# Patient Record
Sex: Female | Born: 1965 | Hispanic: No | Marital: Single | Smoking: Current every day smoker
Health system: Southern US, Community
[De-identification: ages and names within clinical notes are randomized; demographics above are authoritative.]

## PROBLEM LIST (undated history)

## (undated) DIAGNOSIS — J449 Chronic obstructive pulmonary disease, unspecified: Secondary | ICD-10-CM

## (undated) DIAGNOSIS — J45909 Unspecified asthma, uncomplicated: Secondary | ICD-10-CM

## (undated) DIAGNOSIS — F32A Depression, unspecified: Secondary | ICD-10-CM

## (undated) DIAGNOSIS — F329 Major depressive disorder, single episode, unspecified: Secondary | ICD-10-CM

## (undated) DIAGNOSIS — K635 Polyp of colon: Secondary | ICD-10-CM

## (undated) DIAGNOSIS — E871 Hypo-osmolality and hyponatremia: Secondary | ICD-10-CM

## (undated) DIAGNOSIS — G40909 Epilepsy, unspecified, not intractable, without status epilepticus: Secondary | ICD-10-CM

## (undated) DIAGNOSIS — I10 Essential (primary) hypertension: Secondary | ICD-10-CM

## (undated) DIAGNOSIS — K219 Gastro-esophageal reflux disease without esophagitis: Secondary | ICD-10-CM

## (undated) DIAGNOSIS — E559 Vitamin D deficiency, unspecified: Secondary | ICD-10-CM

## (undated) HISTORY — DX: Polyp of colon: K63.5

## (undated) HISTORY — DX: Chronic obstructive pulmonary disease, unspecified: J44.9

## (undated) HISTORY — DX: Unspecified asthma, uncomplicated: J45.909

## (undated) HISTORY — DX: Depression, unspecified: F32.A

## (undated) HISTORY — DX: Epilepsy, unspecified, not intractable, without status epilepticus: G40.909

## (undated) HISTORY — DX: Major depressive disorder, single episode, unspecified: F32.9

## (undated) HISTORY — DX: Essential (primary) hypertension: I10

## (undated) HISTORY — DX: Gastro-esophageal reflux disease without esophagitis: K21.9

## (undated) HISTORY — DX: Hypo-osmolality and hyponatremia: E87.1

## (undated) HISTORY — DX: Vitamin D deficiency, unspecified: E55.9

## (undated) HISTORY — PX: CHOLECYSTECTOMY: SHX55

---

## 2015-05-18 ENCOUNTER — Encounter: Payer: Self-pay | Admitting: *Deleted

## 2015-05-19 ENCOUNTER — Institutional Professional Consult (permissible substitution): Payer: Self-pay | Admitting: Internal Medicine

## 2015-05-31 ENCOUNTER — Other Ambulatory Visit (HOSPITAL_COMMUNITY)
Admission: RE | Admit: 2015-05-31 | Discharge: 2015-05-31 | Disposition: A | Discharge status: Home / Self Care | Payer: 59 | Source: Ambulatory Visit | Attending: Obstetrics and Gynecology | Admitting: Obstetrics and Gynecology

## 2015-05-31 DIAGNOSIS — Z1151 Encounter for screening for human papillomavirus (HPV): Secondary | ICD-10-CM | POA: Diagnosis present

## 2015-05-31 DIAGNOSIS — Z01419 Encounter for gynecological examination (general) (routine) without abnormal findings: Secondary | ICD-10-CM | POA: Diagnosis present

## 2015-06-28 ENCOUNTER — Other Ambulatory Visit: Payer: Self-pay

## 2015-06-28 DIAGNOSIS — Z1231 Encounter for screening mammogram for malignant neoplasm of breast: Secondary | ICD-10-CM

## 2015-07-12 ENCOUNTER — Ambulatory Visit: Payer: 59

## 2015-07-18 ENCOUNTER — Ambulatory Visit
Admission: RE | Admit: 2015-07-18 | Discharge: 2015-07-18 | Disposition: A | Discharge status: Home / Self Care | Payer: 59 | Source: Ambulatory Visit

## 2015-07-18 DIAGNOSIS — Z1231 Encounter for screening mammogram for malignant neoplasm of breast: Secondary | ICD-10-CM

## 2016-02-26 ENCOUNTER — Encounter: Payer: Self-pay | Admitting: Neurology

## 2016-02-26 ENCOUNTER — Other Ambulatory Visit: Payer: BLUE CROSS/BLUE SHIELD

## 2016-02-26 ENCOUNTER — Ambulatory Visit (INDEPENDENT_AMBULATORY_CARE_PROVIDER_SITE_OTHER): Payer: Self-pay | Admitting: Neurology

## 2016-02-26 VITALS — BP 130/70 | HR 121 | Ht 61.0 in | Wt 156.5 lb

## 2016-02-26 DIAGNOSIS — G40909 Epilepsy, unspecified, not intractable, without status epilepticus: Secondary | ICD-10-CM | POA: Insufficient documentation

## 2016-02-26 DIAGNOSIS — G40309 Generalized idiopathic epilepsy and epileptic syndromes, not intractable, without status epilepticus: Secondary | ICD-10-CM

## 2016-02-26 DIAGNOSIS — F17200 Nicotine dependence, unspecified, uncomplicated: Secondary | ICD-10-CM | POA: Diagnosis not present

## 2016-02-26 DIAGNOSIS — Z5181 Encounter for therapeutic drug level monitoring: Secondary | ICD-10-CM

## 2016-02-26 DIAGNOSIS — Z720 Tobacco use: Secondary | ICD-10-CM | POA: Insufficient documentation

## 2016-02-26 MED ORDER — DIVALPROEX SODIUM ER 500 MG PO TB24: ORAL_TABLET | ORAL | Status: DC

## 2016-02-26 NOTE — Patient Instructions (Signed)
Return in 6 months

## 2016-02-26 NOTE — Progress Notes (Signed)
Note routed
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
## 2016-02-29 ENCOUNTER — Ambulatory Visit: Payer: Self-pay | Admitting: Neurology

## 2016-08-29 ENCOUNTER — Ambulatory Visit: Payer: BLUE CROSS/BLUE SHIELD | Admitting: Neurology

## 2016-10-31 ENCOUNTER — Other Ambulatory Visit: Payer: Self-pay | Admitting: Obstetrics and Gynecology

## 2016-10-31 DIAGNOSIS — Z1231 Encounter for screening mammogram for malignant neoplasm of breast: Secondary | ICD-10-CM

## 2016-12-09 ENCOUNTER — Ambulatory Visit
Admission: RE | Admit: 2016-12-09 | Discharge: 2016-12-09 | Disposition: A | Discharge status: Home / Self Care | Payer: BLUE CROSS/BLUE SHIELD | Source: Ambulatory Visit | Attending: Obstetrics and Gynecology | Admitting: Obstetrics and Gynecology

## 2016-12-09 DIAGNOSIS — Z1231 Encounter for screening mammogram for malignant neoplasm of breast: Secondary | ICD-10-CM

## 2017-01-06 ENCOUNTER — Ambulatory Visit: Payer: BLUE CROSS/BLUE SHIELD | Admitting: Neurology
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
## 2017-01-10 ENCOUNTER — Encounter: Payer: Self-pay | Admitting: Neurology

## 2017-01-22 ENCOUNTER — Other Ambulatory Visit: Payer: Self-pay | Admitting: Neurology

## 2017-02-25 ENCOUNTER — Other Ambulatory Visit: Payer: Self-pay | Admitting: *Deleted

## 2017-02-25 ENCOUNTER — Telehealth: Payer: Self-pay | Admitting: Neurology

## 2017-02-25 MED ORDER — DIVALPROEX SODIUM ER 500 MG PO TB24: ORAL_TABLET | ORAL | 3 refills | Status: DC

## 2017-02-25 NOTE — Telephone Encounter (Signed)
PT needs a refill of Depakote called in

## 2017-02-25 NOTE — Telephone Encounter (Signed)
Rx sent 

## 2017-04-20 ENCOUNTER — Encounter (HOSPITAL_COMMUNITY): Payer: Self-pay | Admitting: Emergency Medicine

## 2017-04-20 ENCOUNTER — Ambulatory Visit (HOSPITAL_COMMUNITY)
Admission: EM | Admit: 2017-04-20 | Discharge: 2017-04-20 | Disposition: A | Discharge status: Home / Self Care | Payer: BLUE CROSS/BLUE SHIELD | Attending: Family Medicine | Admitting: Family Medicine

## 2017-04-20 DIAGNOSIS — R1031 Right lower quadrant pain: Secondary | ICD-10-CM

## 2017-04-20 LAB — POCT URINALYSIS DIP (DEVICE)
Bilirubin Urine: NEGATIVE
GLUCOSE, UA: NEGATIVE mg/dL
Hgb urine dipstick: NEGATIVE
Ketones, ur: NEGATIVE mg/dL
LEUKOCYTES UA: NEGATIVE
NITRITE: NEGATIVE
Protein, ur: NEGATIVE mg/dL
Specific Gravity, Urine: 1.015 (ref 1.005–1.030)
UROBILINOGEN UA: 1 mg/dL (ref 0.0–1.0)
pH: 6 (ref 5.0–8.0)

## 2017-04-20 MED ORDER — DULERA 100-5 MCG/ACT IN AERO: 2.0000 | INHALATION_SPRAY | Freq: Every day | RESPIRATORY_TRACT | 10 refills | Status: AC

## 2017-04-20 MED ORDER — KETOROLAC TROMETHAMINE 30 MG/ML IJ SOLN
30.0000 mg | Freq: Once | INTRAMUSCULAR | Status: AC
  Administered 2017-04-20: 30 mg via INTRAMUSCULAR

## 2017-04-20 MED ORDER — TAMSULOSIN HCL 0.4 MG PO CAPS: 0.4000 mg | ORAL_CAPSULE | Freq: Every day | ORAL | 0 refills | Status: AC

## 2017-04-20 MED ORDER — KETOROLAC TROMETHAMINE 30 MG/ML IJ SOLN
INTRAMUSCULAR | Status: AC
  Filled 2017-04-20: qty 1

## 2017-04-20 NOTE — Discharge Instructions (Signed)
Although the urine does not suggest a kidney stone, your symptoms are classic for a kidney stone. I recommend that you take the medicine prescribed for kidney stones and that if the pain worsens over the night or you develop nausea and vomiting, codeine [facility] emergency department.

## 2017-04-20 NOTE — ED Provider Notes (Signed)
[facility]    CSN: [id number] Arrival date & time: 04/20/17  1849     History   Chief Complaint Chief Complaint  Patient presents with  . Abdominal Cramping    HPI [place] is a 51 y.o. female.   The patient presented to the [facility] with a complaint of right side abdominal pain that started after she bent over. The patient reported dysuria after this. The patient reported a previous hx of kidney stones.  Patient reports that the pain began about an hour before arrival here, but since she's been in the room, the pain is diminished significantly. She has a little bit of nausea and the pain radiates from her right lower quadrant to the back.      Past Medical History:  Diagnosis Date  . Asthma   . Colonic polyp   . COPD (chronic obstructive pulmonary disease) (HCC)   . Depression   . Epilepsy (HCC)   . GERD (gastroesophageal reflux disease)   . HTN (hypertension)   . Hyponatremia   . Vitamin D deficiency     Patient Active Problem List   Diagnosis Date Noted  . Current tobacco use 02/26/2016  . Seizure disorder (HCC) 02/26/2016    Past Surgical History:  Procedure Laterality Date  . CHOLECYSTECTOMY      OB History    No data available       Home Medications    Prior to Admission medications   Medication Sig Start Date End Date Taking? Authorizing Provider  albuterol (PROAIR HFA) 108 (90 Base) MCG/ACT inhaler  01/25/15   [provider]  amLODipine (NORVASC) 5 MG tablet Take 5 mg by mouth. 09/07/14   [provider]  cetirizine (ZYRTEC) 10 MG tablet Take 10 mg by mouth.    [provider]  Cholecalciferol 2000 units TABS Take by mouth.    [provider]  divalproex (DEPAKOTE ER) 500 MG 24 hr tablet 1500 on T, Th, Sat, Sun 1000 on M, W, F 02/25/17   Patel, Donika K, DO  DULERA 100-5 MCG/ACT AERO Inhale 2 puffs into the lungs daily. As directed 04/20/17   Elvina Sidle, MD  escitalopram (LEXAPRO) 20 MG  tablet Take 20 mg by mouth daily. 01/18/16   [provider]  estradiol (VIVELLE-DOT) 0.0375 MG/24HR Place 1 patch onto the skin.    [provider]  lansoprazole (PREVACID) 30 MG capsule  03/29/15   [provider]  methylphenidate (RITALIN) 5 MG tablet Take 5 mg by mouth. 11/11/14   [provider]  omeprazole (PRILOSEC) 20 MG capsule Take by mouth daily. 01/05/16   [provider]  progesterone (PROMETRIUM) 100 MG capsule Take 100 mg by mouth.    [provider]  SPIRIVA HANDIHALER 18 MCG inhalation capsule  02/24/16   [provider]  tamsulosin (FLOMAX) 0.4 MG CAPS capsule Take 1 capsule (0.4 mg total) by mouth daily. 04/20/17   Elvina Sidle, MD  venlafaxine XR (EFFEXOR-XR) 150 MG 24 hr capsule Take 150 mg by mouth. 07/30/14   [provider]  zolpidem (AMBIEN) 10 MG tablet TAKE 1 TABLET BY MOUTH AT BEDTIME AS NEEDED FOR insomnia 02/02/16   [provider]    Family History Family History  Problem Relation Age of Onset  . Dementia Father        MILD  . Gout Father   . Hypertension Father   . Diabetes Father        Borderline  .  GER disease Father   . Other Mother        Deceased, choking  . Heart attack Mother   . Healthy Sister     Social History Social History  Substance Use Topics  . Smoking status: Current Every Day Smoker    Packs/day: 1.00    Years: 33.00    Types: Cigarettes    Start date: 11/25/1981  . Smokeless tobacco: Never Used     Comment: started at age 53yr old  . Alcohol use No     Allergies   Penicillins   Review of Systems Review of Systems  Gastrointestinal: Positive for nausea.  Genitourinary: Positive for flank pain.  All other systems reviewed and are negative.    Physical Exam Triage Vital Signs ED Triage Vitals  Enc Vitals Group     BP 04/20/17 1912 (!) 157/69     Pulse Rate 04/20/17 1912 95     Resp 04/20/17 1912 20     Temp 04/20/17 1912 98.3 F (36.8  C)     Temp Source 04/20/17 1912 Oral     SpO2 04/20/17 1912 99 %     Weight --      Height --      Head Circumference --      Peak Flow --      Pain Score 04/20/17 1910 5     Pain Loc --      Pain Edu? --      Excl. in GC? --    No data found.   Updated Vital Signs BP (!) 157/69 (BP Location: Right Arm)   Pulse 95   Temp 98.3 F (36.8 C) (Oral)   Resp 20   SpO2 99%      Physical Exam  Constitutional: She is oriented to person, place, and time. She appears well-developed and well-nourished.  HENT:  Right Ear: External ear normal.  Left Ear: External ear normal.  Mouth/Throat: Oropharynx is clear and moist.  Eyes: Conjunctivae are normal. Pupils are equal, round, and reactive to light.  Neck: Normal range of motion. Neck supple.  Cardiovascular: Normal rate, regular rhythm and normal heart sounds.   Pulmonary/Chest: Effort normal and breath sounds normal.  Abdominal: Soft. There is tenderness. There is guarding.  Some tenderness and right flank and there is also tenderness in the right lower quad with deep palpation  Musculoskeletal: Normal range of motion.  Neurological: She is alert and oriented to person, place, and time.  Skin: Skin is warm and dry.  Nursing note and vitals reviewed.    UC Treatments / Results  Labs (all labs ordered are listed, but only abnormal results are displayed) Labs Reviewed  POCT URINALYSIS DIP (DEVICE)    EKG  EKG Interpretation None       Radiology No results found.  Procedures Procedures (including critical care time)  Medications Ordered in UC Medications  ketorolac (TORADOL) 30 MG/ML injection 30 mg (not administered)     Initial Impression / Assessment and Plan / UC Course  I have reviewed the triage vital signs and the nursing notes.  Pertinent labs & imaging results that were available during my care of the patient were reviewed by me and considered in my medical decision making (see chart for details).       Final Clinical Impressions(s) / UC Diagnoses   Final diagnoses:  Right lower quadrant abdominal pain    New Prescriptions New Prescriptions   TAMSULOSIN (FLOMAX) 0.4 MG CAPS CAPSULE  Take 1 capsule (0.4 mg total) by mouth daily.     Elvina SidleLauenstein, Shakirah Kirkey, MD 04/20/17 2007

## 2017-04-20 NOTE — ED Triage Notes (Signed)
[facility] a [facility] of right side abdominal pain that started after she bent over. The patient reported dysuria after this. The patient reported a previous hx of kidney stones.

## 2017-05-31 ENCOUNTER — Other Ambulatory Visit: Payer: Self-pay | Admitting: Neurology

## 2017-06-02 ENCOUNTER — Ambulatory Visit (INDEPENDENT_AMBULATORY_CARE_PROVIDER_SITE_OTHER): Payer: BLUE CROSS/BLUE SHIELD | Admitting: Neurology

## 2017-06-02 ENCOUNTER — Encounter: Payer: Self-pay | Admitting: Neurology

## 2017-06-02 VITALS — BP 130/70 | HR 98 | Ht 61.0 in | Wt 160.6 lb

## 2017-06-02 DIAGNOSIS — G40309 Generalized idiopathic epilepsy and epileptic syndromes, not intractable, without status epilepticus: Secondary | ICD-10-CM

## 2017-06-02 DIAGNOSIS — F17200 Nicotine dependence, unspecified, uncomplicated: Secondary | ICD-10-CM

## 2017-06-02 MED ORDER — DIVALPROEX SODIUM ER 500 MG PO TB24: ORAL_TABLET | ORAL | 3 refills | Status: DC

## 2017-06-02 MED ORDER — LEVETIRACETAM 250 MG PO TABS: ORAL_TABLET | ORAL | 3 refills | Status: DC

## 2017-06-02 MED ORDER — DIVALPROEX SODIUM ER 250 MG PO TB24: ORAL_TABLET | ORAL | 3 refills | Status: AC

## 2017-06-02 NOTE — Patient Instructions (Addendum)
Start taking Depakote 1250mg  daily Return to clinic in 1 year
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
## 2017-06-27 ENCOUNTER — Other Ambulatory Visit: Payer: Self-pay | Admitting: *Deleted

## 2017-06-27 ENCOUNTER — Telehealth: Payer: Self-pay | Admitting: Neurology

## 2017-06-27 DIAGNOSIS — G4700 Insomnia, unspecified: Secondary | ICD-10-CM

## 2017-06-27 MED ORDER — ZOLPIDEM TARTRATE 10 MG PO TABS: ORAL_TABLET | ORAL | 0 refills | Status: AC

## 2017-06-27 NOTE — Telephone Encounter (Signed)
Called patient and asked if she had called her PCP.  She has just been set up with a new PCP and wont be able to see him until 8-20.  Is there anything we can call in for her?

## 2017-06-27 NOTE — Telephone Encounter (Signed)
Rx called in . Patient notified

## 2017-06-27 NOTE — Telephone Encounter (Signed)
Ambien is on her med list, can call in #10 tablets to take prn for insomnia until she sees her PCP. Thanks

## 2017-06-27 NOTE — Telephone Encounter (Signed)
Pt called stating that her father died yesterday and she would like something to help her sleep.  Please call and advise

## 2017-09-24 ENCOUNTER — Other Ambulatory Visit: Payer: Self-pay | Admitting: Neurology

## 2017-12-12 IMAGING — MG 2D DIGITAL SCREENING BILATERAL MAMMOGRAM WITH CAD AND ADJUNCT TO
8 of 12 series · 8 of 28 positions shown · non-contrast
Comparison: Previous exam(s).

CLINICAL DATA: Screening.

EXAM:
2D DIGITAL SCREENING BILATERAL MAMMOGRAM WITH CAD AND ADJUNCT TOMO

[R CC synth-2D]
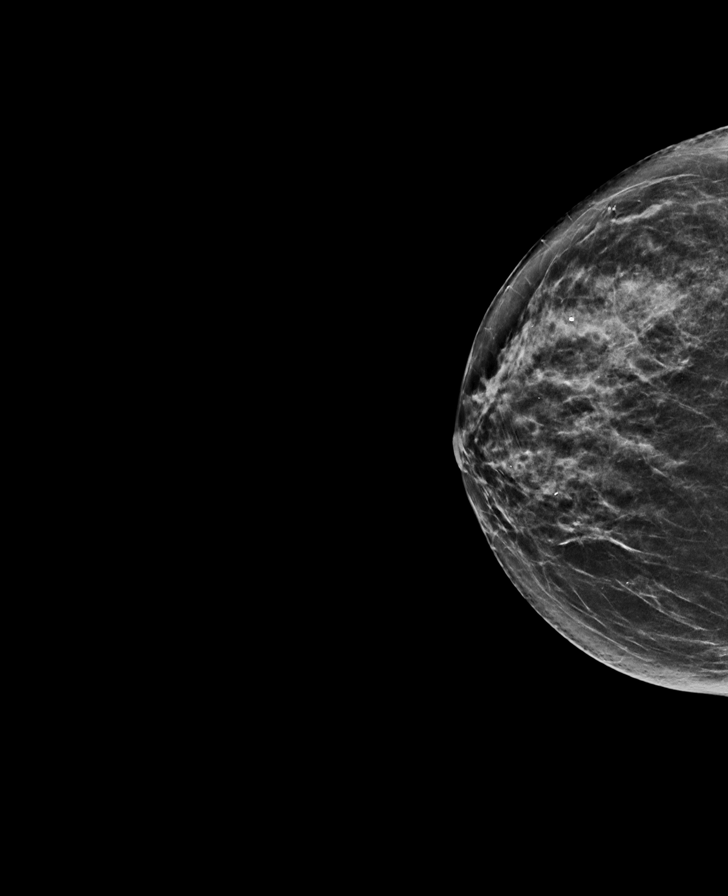

[L MLO synth-2D]
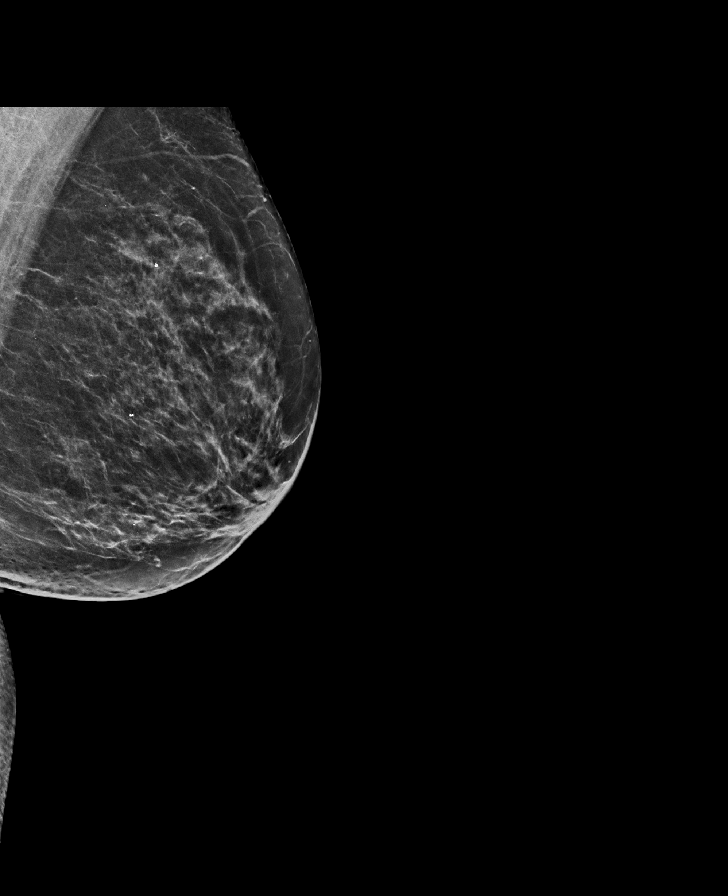

[R MLO synth-2D]
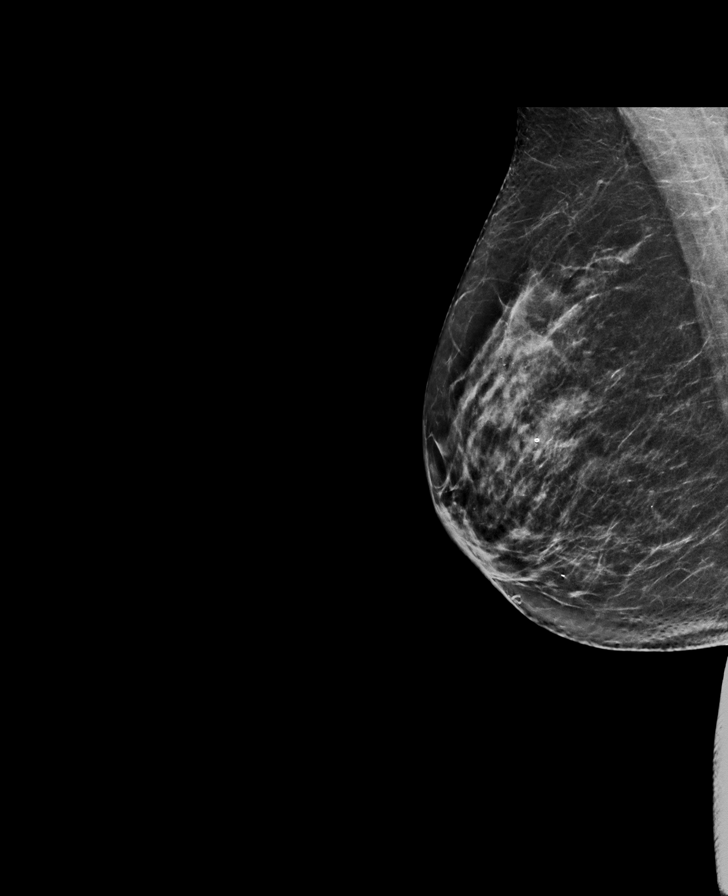

[L MLO]
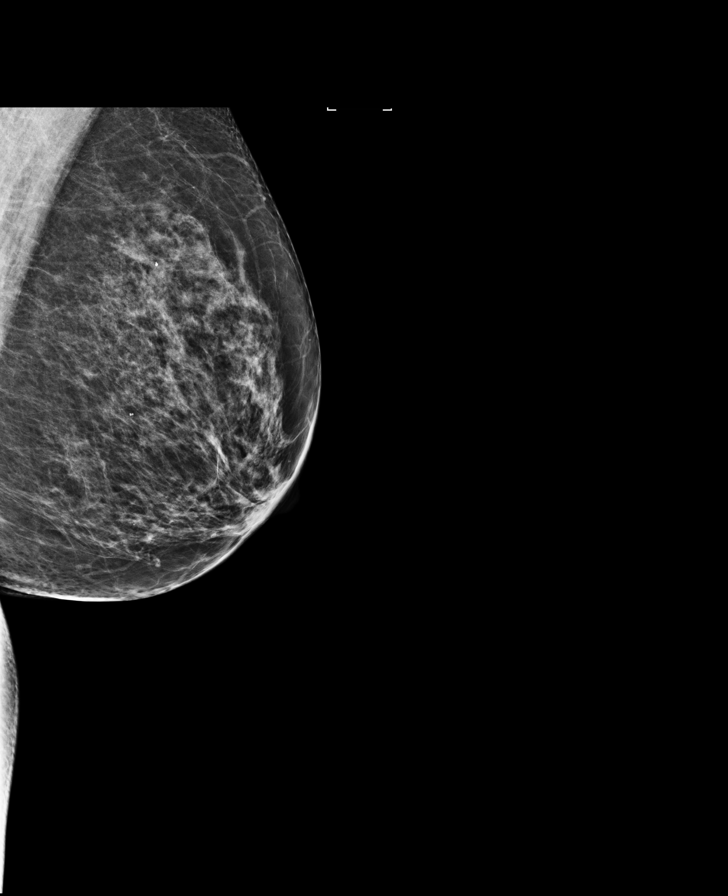

[R CC]
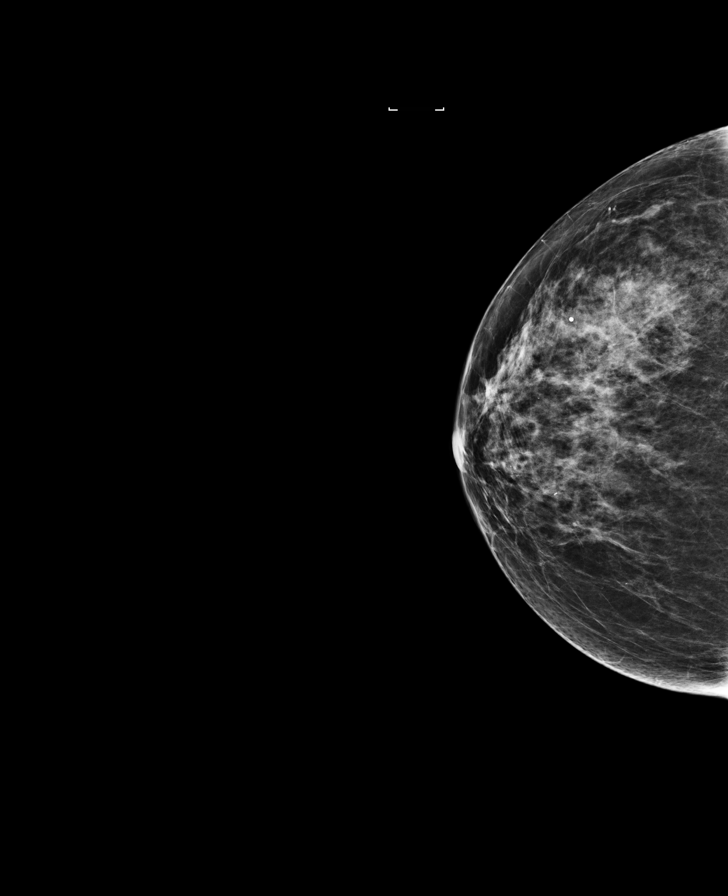

[L CC]
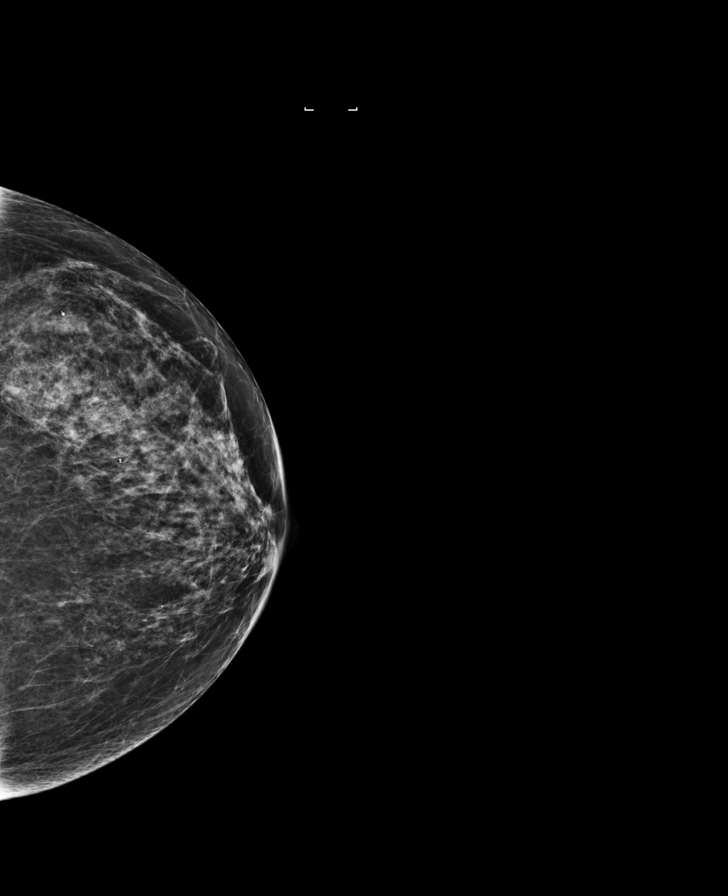

[L CC synth-2D]
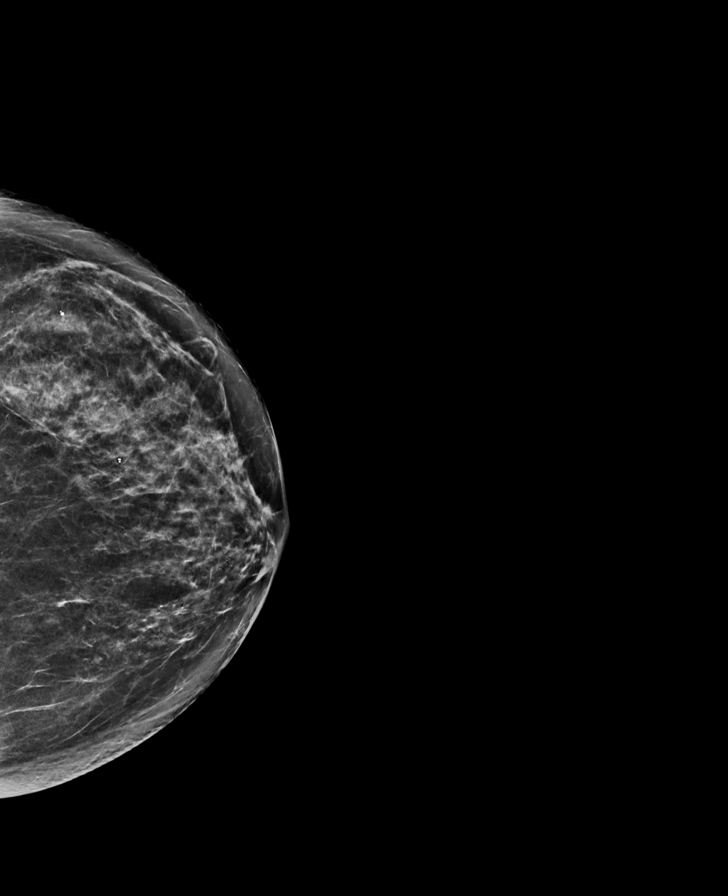

[R MLO]
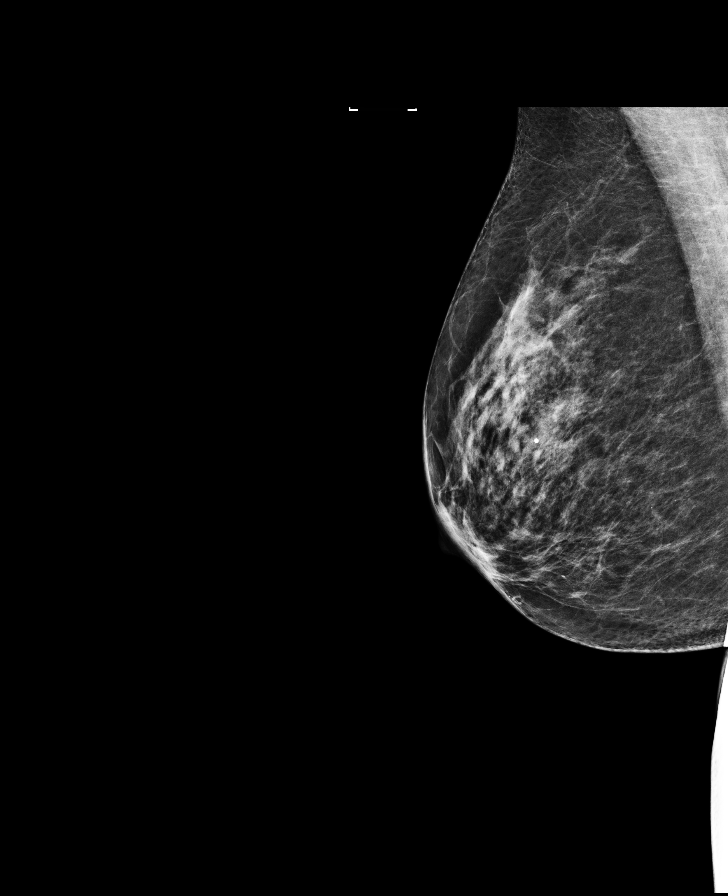

[8 of 28 positions shown; findings below may reference images not displayed]

ACR Breast Density Category c: The breast tissue is heterogeneously
dense, which may obscure small masses.
FINDINGS: There are no findings suspicious for malignancy. Images were
processed with CAD.
IMPRESSION: No mammographic evidence of malignancy. A result letter of this
screening mammogram will be mailed directly to the patient.

RECOMMENDATION:
Screening mammogram in one year. (Code:[id number])

BI-RADS CATEGORY  1: Negative.

## 2018-04-28 ENCOUNTER — Other Ambulatory Visit: Payer: Self-pay | Admitting: Neurology

## 2018-06-05 ENCOUNTER — Ambulatory Visit: Payer: BLUE CROSS/BLUE SHIELD | Admitting: Neurology

## 2018-06-17 ENCOUNTER — Other Ambulatory Visit: Payer: Self-pay | Admitting: Neurology
